# Patient Record
Sex: Female | Born: 1995 | Race: White | Hispanic: No | Marital: Single | Smoking: Never smoker
Health system: Southern US, Community
[De-identification: ages and names within clinical notes are randomized; demographics above are authoritative.]

## PROBLEM LIST (undated history)

## (undated) DIAGNOSIS — K219 Gastro-esophageal reflux disease without esophagitis: Secondary | ICD-10-CM

## (undated) HISTORY — PX: TONSILLECTOMY: SUR1361

---
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
## 2019-11-17 ENCOUNTER — Emergency Department (HOSPITAL_COMMUNITY): Payer: 59

## 2019-11-17 ENCOUNTER — Emergency Department (HOSPITAL_COMMUNITY)
Admission: EM | Admit: 2019-11-17 | Discharge: 2019-11-17 | Disposition: A | Discharge status: Home / Self Care | Payer: 59 | Attending: Emergency Medicine | Admitting: Emergency Medicine

## 2019-11-17 ENCOUNTER — Other Ambulatory Visit: Payer: Self-pay

## 2019-11-17 ENCOUNTER — Encounter (HOSPITAL_COMMUNITY): Payer: Self-pay

## 2019-11-17 DIAGNOSIS — Z79899 Other long term (current) drug therapy: Secondary | ICD-10-CM | POA: Insufficient documentation

## 2019-11-17 DIAGNOSIS — R109 Unspecified abdominal pain: Secondary | ICD-10-CM | POA: Insufficient documentation

## 2019-11-17 DIAGNOSIS — K5901 Slow transit constipation: Secondary | ICD-10-CM | POA: Diagnosis not present

## 2019-11-17 DIAGNOSIS — K5900 Constipation, unspecified: Secondary | ICD-10-CM | POA: Diagnosis present

## 2019-11-17 LAB — I-STAT BETA HCG BLOOD, ED (MC, WL, AP ONLY): I-stat hCG, quantitative: <5 m[IU]/mL (ref <5)

## 2019-11-17 MED ORDER — GOLYTELY 236 G PO SOLR: 4.0000 L | Freq: Once | ORAL | 0 refills | Status: AC

## 2019-11-17 MED ORDER — GOLYTELY 236 G PO SOLR: 4.0000 L | Freq: Once | ORAL | 0 refills | Status: DC

## 2019-11-17 NOTE — ED Provider Notes (Signed)
[place] DEPT Provider Note   CSN: [id number] Arrival date & time: 11/17/19  0016  History Chief Complaint  Patient presents with  . Constipation    Jennifer Wilkerson is a 24 y.o. female.  The history is provided by the patient.  Constipation She states that she has not been able to have a normal bowel movement for the past week.  She has been able to pass a very small amount of stool as well as passing small amount of flatus.  There has been some abdominal cramping which is mild and improved following passage of flatus.  She has had problems with constipation in the past.  She has taken bisacodyl, polyethylene glycol, and attempted using an enema, and took some mineral oil - all with no relief.  History reviewed. No pertinent past medical history.  There are no problems to display for this patient.   Past Surgical History:  Procedure Laterality Date  . TONSILLECTOMY       OB History   No obstetric history on file.     History reviewed. No pertinent family history.  Social History   Tobacco Use  . Smoking status: Never Smoker  . Smokeless tobacco: Never Used  Substance Use Topics  . Alcohol use: No  . Drug use: No    Home Medications Prior to Admission medications   Medication Sig Start Date End Date Taking? Authorizing Provider  cyclobenzaprine (FLEXERIL) 5 MG tablet Take 1 tablet (5 mg total) by mouth 3 (three) times daily as needed for muscle spasms. 08/18/16   Ashley Murrain, NP  naproxen (NAPROSYN) 500 MG tablet Take 1 tablet (500 mg total) by mouth 2 (two) times daily. 08/18/16   Ashley Murrain, NP  sertraline (ZOLOFT) 50 MG tablet Take 50 mg by mouth daily.    [provider]    Allergies    Patient has no known allergies.  Review of Systems   Review of Systems  Gastrointestinal: Positive for constipation.  All other systems reviewed and are negative.   Physical Exam Updated Vital Signs BP 116/82 (BP Location:  Right Arm)   Pulse (!) 103   Temp 98.4 F (36.9 C) (Oral)   Resp 18   Ht 5\' 6"  (1.676 m)   Wt 88.5 kg   SpO2 98%   BMI 31.47 kg/m   Physical Exam Vitals and nursing note reviewed.   24 year old female, resting comfortably and in no acute distress. Vital signs are significant for borderline elevated heart rate. Oxygen saturation is 98%, which is normal. Head is normocephalic and atraumatic. PERRLA, EOMI. Oropharynx is clear. Neck is nontender and supple without adenopathy or JVD. Back is nontender and there is no CVA tenderness. Lungs are clear without rales, wheezes, or rhonchi. Chest is nontender. Heart has regular rate and rhythm without murmur. Abdomen is soft, flat, nontender without masses or hepatosplenomegaly and peristalsis is normoactive. Rectal: Normal sphincter tone.  No palpable stool present. Extremities have no cyanosis or edema, full range of motion is present. Skin is warm and dry without rash. Neurologic: Mental status is normal, cranial nerves are intact, there are no motor or sensory deficits.  ED Results / Procedures / Treatments    Radiology DG Abdomen 1 View  Result Date: 11/17/2019 CLINICAL DATA:  Constipation EXAM: ABDOMEN - 1 VIEW COMPARISON:  None. FINDINGS: The stool burden is average. There is mild gaseous distention of the colon. The bowel gas pattern is nonobstructive. IMPRESSION: 1. Nonobstructive bowel  gas pattern. 2. Average stool burden. Electronically Signed   By: Constance Holster M.D.   On: 11/17/2019 02:30    Procedures Procedures   Medications Ordered in ED Medications - No data to display  ED Course  I have reviewed the triage vital signs and the nursing notes.  Pertinent imaging results that were available during my care of the patient were reviewed by me and considered in my medical decision making (see chart for details).  MDM Rules/Calculators/A&P Constipation without evidence of fecal impaction.  Will check abdominal x-ray  to assess stool burden.  Old records are reviewed, and she has no relevant past visits.  X-rays shows normal stool burden, but stool is mostly in the right colon.  Patient now tells me that she also had taken a dose of magnesium citrate without any improvement.  She is given a prescription for GoLYTELY, advised to take fiber supplements as needed.  She states that she currently does take a fiber supplement, advised that the dose should be increased until she is having bowel movements on a regular basis.  This may be more than what is suggested on the product label.  Also recommended use of polyethylene glycol as needed.  She is referred to gastroenterology for further outpatient evaluation.  She may be a candidate for Linzess.  Final Clinical Impression(s) / ED Diagnoses Final diagnoses:  Constipation by delayed colonic transit    Rx / DC Orders ED Discharge Orders         Ordered    polyethylene glycol (GOLYTELY) 236 g solution   Once     11/17/19 [id number]           Delora Fuel, MD [id number] [phone]

## 2019-11-17 NOTE — ED Triage Notes (Signed)
Pt reports constipation x 1 weeks. She states that she was able to have a small BM today with small amounts of bright red blood. Reports that it was painful. Denies vomiting.

## 2019-11-17 NOTE — Discharge Instructions (Signed)
Please increase your fiber supplement until you are having regular bowel movements without having to strain.  He may have to take a larger amount of the supplement than the instructions on the bottle tell you to take.  You may take polyethylene glycol (MiraLAX) as needed.  You may take up to 4 doses a day as needed.

## 2021-04-29 ENCOUNTER — Other Ambulatory Visit: Payer: Self-pay | Admitting: Physician Assistant

## 2021-04-29 DIAGNOSIS — R109 Unspecified abdominal pain: Secondary | ICD-10-CM

## 2021-04-29 DIAGNOSIS — R197 Diarrhea, unspecified: Secondary | ICD-10-CM

## 2021-05-18 ENCOUNTER — Ambulatory Visit
Admission: RE | Admit: 2021-05-18 | Discharge: 2021-05-18 | Disposition: A | Discharge status: Home / Self Care | Payer: 59 | Source: Ambulatory Visit | Attending: Physician Assistant | Admitting: Physician Assistant

## 2021-05-18 ENCOUNTER — Other Ambulatory Visit: Payer: Self-pay

## 2021-05-18 DIAGNOSIS — R109 Unspecified abdominal pain: Secondary | ICD-10-CM

## 2021-05-18 DIAGNOSIS — R197 Diarrhea, unspecified: Secondary | ICD-10-CM

## 2021-05-18 MED ORDER — IOPAMIDOL (ISOVUE-300) INJECTION 61%
100.0000 mL | Freq: Once | INTRAVENOUS | Status: AC | PRN
  Administered 2021-05-18: 100 mL via INTRAVENOUS

## 2021-06-28 NOTE — H&P (Signed)
Jennifer Wilkerson is an 25 y.o. G0 who is admitted for Laparoscopic ovarian cystectomy, removal of pelvic mass, and possible unilateral salpingo-oophorectomy.  Patient had initial GYN evaluation on 9/14 with Evlyn Kanner, FNP-C as a referral from [facility], [place]) for an incidental finding of a left adnexal mass measuring 11.2cm on CT A/P on 05/18/21.  Patient was undergoing GI work-up for evaluation of diarrhea with associated loose stools, reflux, and abdominal pain which have progressively gotten worse for the past year.  As documented below, TVUS revealed 12.3cm mass on the left side.  Counseled patient regarding risks/benefits/alternatives regarding surveillance vs surgical management. Reviewed recommendation regarding surgical management which patient was in agreement.  Pertinent Labs: ROMA: CA-125 13.4  HE4 38.3  Score 0.41 (premenopausal) -  Low risk for malignancy Normal pap smear in 2021, per patient  TVUS (06/16/2021): Uterus 7.78 x 4.45 x 4.71cm Endometrial thickness 1.42cm Anteverted uterus. No uterine anomalies. Endometrium thickened - no blood flow noted. Right ovary within normal limits. Left ovary not visualized. Left adnexa - cyst with tiny echogenic particles seen within 12.3 x 9.0 x 10.4cm, avascular - questionable left ovary   CT A/P (05/18/21): FINDINGS: Lower Chest: No acute findings.   Hepatobiliary: No hepatic masses identified. Gallbladder is unremarkable. No evidence of biliary ductal dilatation.   Pancreas:  No mass or inflammatory changes.   Spleen: Within normal limits in size and appearance.   Adrenals/Urinary Tract: No masses identified. No evidence of ureteral calculi or hydronephrosis. Unremarkable unopacified urinary bladder.   Stomach/Bowel: No evidence of bowel wall thickening, abnormal contrast enhancement, or mesenteric inflammatory changes. No evidence of stricture or dilatation. No signs of penetrating disease, fistulae, or abnormal  fluid collections. The terminal ileum is normal in appearance. Normal appendix visualized.   Vascular/Lymphatic: No pathologically enlarged lymph nodes. No acute vascular findings.   Reproductive: Uterus is normal in appearance. A large simple appearing cystic lesion is seen in the left adnexa which measures 11.2 x 8.8 cm. No other masses identified. No evidence of inflammatory process or free fluid.   Other:  None.   Musculoskeletal:  No suspicious bone lesions identified.   IMPRESSION: No radiographic evidence of inflammatory bowel disease.   11.2 cm benign-appearing cystic lesion in left adnexa. 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 Recommend GYN consult and consider pelvis MRI w/o and w/ contrast if clinically warranted. Note: This recommendation does not apply to premenarchal patients and to those with increased risk (genetic, family history, elevated tumor markers or other high-risk factors) of ovarian cancer. Reference: JACR 2020 Feb; 17(2):248-254.   Patient Active Problem List   Diagnosis Date Noted   BMI 40.0-44.9, adult ([place]) 07/03/2021   Adnexal mass 07/03/2021     MEDICAL/FAMILY/SOCIAL HX: No LMP recorded.    No past medical history on file.  Past Surgical History:  Procedure Laterality Date   TONSILLECTOMY      No family history on file.  Social History:  reports that she has never smoked. She has never used smokeless tobacco. She reports that she does not drink alcohol and does not use drugs.  ALLERGIES/MEDS:  Allergies: No Known Allergies  No medications prior to admission.     Review of Systems  Constitutional: Negative.   HENT: Negative.    Eyes: Negative.   Respiratory: Negative.    Cardiovascular: Negative.   Gastrointestinal: Negative.   Genitourinary: Negative.   Musculoskeletal: Negative.   Skin: Negative.   Endo/Heme/Allergies: Negative.   Psychiatric/Behavioral: Negative.  All other systems reviewed and are  negative.  There were no vitals taken for this visit. Gen:  NAD, pleasant and cooperative Cardio:  RRR Pulm:  CTAB, no wheezes/rales/rhonchi Abd:  Soft, non-distended, non-tender throughout, no rebound/guarding, difficult to appreciate mass given patient's habitus Ext:  No bilateral LE edema, no bilateral calf tenderness  No results found for this or any previous visit (from the past 24 hour(s)).  No results found.   ASSESSMENT/PLAN: Jennifer Wilkerson is a 25 y.o. G0 who is admitted for Laparoscopic ovarian cystectomy, removal of pelvic mass, and possible unilateral salpingo-oophorectomy.  - Admit to [facility] labs (CBC, T&S, COVID screen) - Diet:  NPO - IVF:  Per anesthesia - VTE Prophylaxis:  SCDs - Antibiotics: None - Anticipate D/C home same-day  Consents: I have counseled the patient that this surgery is performed to remove a pelvic mass through an incision in the abdomen. Prior to surgery, the risks and benefits of the surgery, as well as alternative treatments, have been discussed. The risks include, but are not limited to, bleeding, including the need for a blood transfusion, infection, damage to surrounding organs and tissues, damage to bladder, damage to ureters, causing kidney damage, and requiring additional procedures, damage to bowels, resulting in further surgery and/or a colostomy, postoperative pain, short-term and long-term, scarring on and inside the abdominal wall, development of an incisional hernia which could require surgery in the future, need for further surgery, deep vein thrombosis and/or pulmonary embolism, wound separation or infection, painful intercourse, urinary leakage, ovarian failure, resulting in menopausal symptoms requiring treatment, complications the course of which cannot be predicted or prevented, and death.  It was also discussed that there is a chance that the mass being removed is found to be cancer during or after your surgery.  There is a  chance that cancer cells could be spread to other parts of your body during the surgery.  There is a chance that additional procedures will be performed to diagnose and treat a cancer fully.  There is a chance that cancer discovered after your surgery is over may need treatments in the future, including additional surgery.  Reviewed possibility regarding need for oophorectomy at the time of surgery if unable to remove cyst alone. Patient was consented for blood products.  The patient is aware that bleeding may result in the need for a blood transfusion which includes risk of transmission of HIV (1:2 million), Hepatitis C (1:2 million), and Hepatitis B (1:200 thousand) and transfusion reaction.  Patient voiced understanding of the above risks as well as understanding of indications for blood transfusion.  Drema Dallas, DO [phone] (office)

## 2021-06-29 ENCOUNTER — Other Ambulatory Visit: Payer: Self-pay

## 2021-06-29 ENCOUNTER — Encounter (HOSPITAL_BASED_OUTPATIENT_CLINIC_OR_DEPARTMENT_OTHER): Payer: Self-pay | Admitting: Obstetrics and Gynecology

## 2021-07-01 ENCOUNTER — Encounter (HOSPITAL_BASED_OUTPATIENT_CLINIC_OR_DEPARTMENT_OTHER)
Admission: RE | Admit: 2021-07-01 | Discharge: 2021-07-01 | Disposition: A | Discharge status: Home / Self Care | Payer: 59 | Source: Ambulatory Visit | Attending: Obstetrics and Gynecology | Admitting: Obstetrics and Gynecology

## 2021-07-01 DIAGNOSIS — Z01818 Encounter for other preprocedural examination: Secondary | ICD-10-CM | POA: Insufficient documentation

## 2021-07-01 LAB — CBC
HCT: 41.9 % (ref 36.0–46.0)
Hemoglobin: 14 g/dL (ref 12.0–15.0)
MCH: 29.6 pg (ref 26.0–34.0)
MCHC: 33.4 g/dL (ref 30.0–36.0)
MCV: 88.6 fL (ref 80.0–100.0)
Platelets: 338 10*3/uL (ref 150–400)
RBC: 4.73 MIL/uL (ref 3.87–5.11)
RDW: 13.4 % (ref 11.5–15.5)
WBC: 9.4 10*3/uL (ref 4.0–10.5)
nRBC: 0 % (ref 0.0–0.2)

## 2021-07-01 LAB — TYPE AND SCREEN
ABO/RH(D): O NEG
Antibody Screen: NEGATIVE

## 2021-07-03 DIAGNOSIS — Z6841 Body Mass Index (BMI) 40.0 and over, adult: Secondary | ICD-10-CM

## 2021-07-03 DIAGNOSIS — N9489 Other specified conditions associated with female genital organs and menstrual cycle: Secondary | ICD-10-CM

## 2021-07-05 ENCOUNTER — Ambulatory Visit (HOSPITAL_BASED_OUTPATIENT_CLINIC_OR_DEPARTMENT_OTHER): Payer: 59 | Admitting: Anesthesiology

## 2021-07-05 ENCOUNTER — Encounter (HOSPITAL_BASED_OUTPATIENT_CLINIC_OR_DEPARTMENT_OTHER): Payer: Self-pay | Admitting: Obstetrics and Gynecology

## 2021-07-05 ENCOUNTER — Other Ambulatory Visit: Payer: Self-pay

## 2021-07-05 ENCOUNTER — Encounter (HOSPITAL_BASED_OUTPATIENT_CLINIC_OR_DEPARTMENT_OTHER)
Admission: RE | Disposition: A | Discharge status: Home / Self Care | Payer: Self-pay | Source: Ambulatory Visit | Attending: Obstetrics and Gynecology

## 2021-07-05 ENCOUNTER — Ambulatory Visit (HOSPITAL_BASED_OUTPATIENT_CLINIC_OR_DEPARTMENT_OTHER)
Admission: RE | Admit: 2021-07-05 | Discharge: 2021-07-05 | Disposition: A | Discharge status: Home / Self Care | Payer: 59 | Source: Ambulatory Visit | Attending: Obstetrics and Gynecology | Admitting: Obstetrics and Gynecology

## 2021-07-05 DIAGNOSIS — D271 Benign neoplasm of left ovary: Secondary | ICD-10-CM | POA: Insufficient documentation

## 2021-07-05 DIAGNOSIS — Z6841 Body Mass Index (BMI) 40.0 and over, adult: Secondary | ICD-10-CM

## 2021-07-05 DIAGNOSIS — N9489 Other specified conditions associated with female genital organs and menstrual cycle: Secondary | ICD-10-CM

## 2021-07-05 DIAGNOSIS — N83202 Unspecified ovarian cyst, left side: Secondary | ICD-10-CM | POA: Diagnosis present

## 2021-07-05 HISTORY — PX: LAPAROSCOPIC OVARIAN CYSTECTOMY: SHX6248

## 2021-07-05 HISTORY — DX: Gastro-esophageal reflux disease without esophagitis: K21.9

## 2021-07-05 LAB — POCT PREGNANCY, URINE: Preg Test, Ur: NEGATIVE

## 2021-07-05 LAB — ABO/RH: ABO/RH(D): O NEG

## 2021-07-05 SURGERY — EXCISION, CYST, OVARY, LAPAROSCOPIC
Anesthesia: General | Site: Uterus

## 2021-07-05 MED ORDER — ONDANSETRON HCL 4 MG/2ML IJ SOLN: 4.0000 mg | Freq: Once | INTRAMUSCULAR | Status: DC | PRN

## 2021-07-05 MED ORDER — OXYCODONE HCL 5 MG PO TABS: 5.0000 mg | ORAL_TABLET | Freq: Once | ORAL | Status: DC | PRN

## 2021-07-05 MED ORDER — PROPOFOL 10 MG/ML IV BOLUS
INTRAVENOUS | Status: DC | PRN
Start: 2021-07-05 — End: 2021-07-05
  Administered 2021-07-05: 200 mg via INTRAVENOUS

## 2021-07-05 MED ORDER — DEXAMETHASONE SODIUM PHOSPHATE 4 MG/ML IJ SOLN
INTRAMUSCULAR | Status: DC | PRN
  Administered 2021-07-05: 10 mg via INTRAVENOUS

## 2021-07-05 MED ORDER — SILVER NITRATE-POT NITRATE 75-25 % EX MISC
CUTANEOUS | Status: AC
  Filled 2021-07-05: qty 10

## 2021-07-05 MED ORDER — BUPIVACAINE HCL (PF) 0.25 % IJ SOLN
INTRAMUSCULAR | Status: DC | PRN
  Administered 2021-07-05: 30 mL

## 2021-07-05 MED ORDER — 0.9 % SODIUM CHLORIDE (POUR BTL) OPTIME
TOPICAL | Status: DC | PRN
  Administered 2021-07-05: 3000 mL

## 2021-07-05 MED ORDER — SUGAMMADEX SODIUM 500 MG/5ML IV SOLN
INTRAVENOUS | Status: DC | PRN
  Administered 2021-07-05: 300 mg via INTRAVENOUS

## 2021-07-05 MED ORDER — OXYCODONE HCL 5 MG PO TABS: 5.0000 mg | ORAL_TABLET | Freq: Four times a day (QID) | ORAL | 0 refills | Status: AC | PRN

## 2021-07-05 MED ORDER — FENTANYL CITRATE (PF) 100 MCG/2ML IJ SOLN
INTRAMUSCULAR | Status: AC
  Filled 2021-07-05: qty 2

## 2021-07-05 MED ORDER — ALBUTEROL SULFATE HFA 108 (90 BASE) MCG/ACT IN AERS
INHALATION_SPRAY | RESPIRATORY_TRACT | Status: DC | PRN
  Administered 2021-07-05 (×2): 2 via RESPIRATORY_TRACT

## 2021-07-05 MED ORDER — AMISULPRIDE (ANTIEMETIC) 5 MG/2ML IV SOLN
10.0000 mg | Freq: Once | INTRAVENOUS | Status: AC | PRN
  Administered 2021-07-05: 10 mg via INTRAVENOUS

## 2021-07-05 MED ORDER — PHENYLEPHRINE HCL (PRESSORS) 10 MG/ML IV SOLN
INTRAVENOUS | Status: DC | PRN
  Administered 2021-07-05: 120 ug via INTRAVENOUS

## 2021-07-05 MED ORDER — PHENYLEPHRINE 40 MCG/ML (10ML) SYRINGE FOR IV PUSH (FOR BLOOD PRESSURE SUPPORT)
PREFILLED_SYRINGE | INTRAVENOUS | Status: AC
  Filled 2021-07-05: qty 10

## 2021-07-05 MED ORDER — FENTANYL CITRATE (PF) 100 MCG/2ML IJ SOLN
INTRAMUSCULAR | Status: DC | PRN
  Administered 2021-07-05: 100 ug via INTRAVENOUS
  Administered 2021-07-05 (×2): 50 ug via INTRAVENOUS

## 2021-07-05 MED ORDER — LIDOCAINE HCL (CARDIAC) PF 100 MG/5ML IV SOSY
PREFILLED_SYRINGE | INTRAVENOUS | Status: DC | PRN
  Administered 2021-07-05: 60 mg via INTRAVENOUS

## 2021-07-05 MED ORDER — ROCURONIUM BROMIDE 100 MG/10ML IV SOLN
INTRAVENOUS | Status: DC | PRN
  Administered 2021-07-05: 10 mg via INTRAVENOUS
  Administered 2021-07-05: 70 mg via INTRAVENOUS

## 2021-07-05 MED ORDER — IBUPROFEN 800 MG PO TABS: 800.0000 mg | ORAL_TABLET | Freq: Three times a day (TID) | ORAL | 0 refills | Status: AC | PRN

## 2021-07-05 MED ORDER — LACTATED RINGERS IV SOLN: INTRAVENOUS | Status: DC

## 2021-07-05 MED ORDER — MIDAZOLAM HCL 2 MG/2ML IJ SOLN
INTRAMUSCULAR | Status: AC
  Filled 2021-07-05: qty 2

## 2021-07-05 MED ORDER — AMISULPRIDE (ANTIEMETIC) 5 MG/2ML IV SOLN
INTRAVENOUS | Status: AC
  Filled 2021-07-05: qty 4

## 2021-07-05 MED ORDER — OXYCODONE HCL 5 MG/5ML PO SOLN: 5.0000 mg | Freq: Once | ORAL | Status: DC | PRN

## 2021-07-05 MED ORDER — FENTANYL CITRATE (PF) 100 MCG/2ML IJ SOLN
25.0000 ug | INTRAMUSCULAR | Status: DC | PRN
  Administered 2021-07-05: 50 ug via INTRAVENOUS

## 2021-07-05 MED ORDER — ONDANSETRON HCL 4 MG/2ML IJ SOLN
INTRAMUSCULAR | Status: DC | PRN
  Administered 2021-07-05: 4 mg via INTRAVENOUS

## 2021-07-05 MED ORDER — MIDAZOLAM HCL 5 MG/5ML IJ SOLN
INTRAMUSCULAR | Status: DC | PRN
  Administered 2021-07-05: 2 mg via INTRAVENOUS

## 2021-07-05 SURGICAL SUPPLY — 37 items
ADH SKN CLS APL DERMABOND .7 (GAUZE/BANDAGES/DRESSINGS) ×1
APL SRG 38 LTWT LNG FL B (MISCELLANEOUS) ×1
APPLICATOR ARISTA FLEXITIP XL (MISCELLANEOUS) ×2 IMPLANT
BAG SPEC RTRVL LRG 6X4 10 (ENDOMECHANICALS)
CABLE HIGH FREQUENCY MONO STRZ (ELECTRODE) ×2 IMPLANT
DERMABOND ADVANCED (GAUZE/BANDAGES/DRESSINGS) ×1
DERMABOND ADVANCED .7 DNX12 (GAUZE/BANDAGES/DRESSINGS) ×1 IMPLANT
DEVICE TROCAR PUNCTURE CLOSURE (ENDOMECHANICALS) ×2 IMPLANT
DRSG OPSITE POSTOP 3X4 (GAUZE/BANDAGES/DRESSINGS) IMPLANT
DURAPREP 26ML APPLICATOR (WOUND CARE) ×2 IMPLANT
GAUZE 4X4 16PLY ~~LOC~~+RFID DBL (SPONGE) ×2 IMPLANT
GLOVE SURG ENC MOIS LTX SZ6.5 (GLOVE) ×4 IMPLANT
GLOVE SURG UNDER POLY LF SZ6.5 (GLOVE) ×2 IMPLANT
GLOVE SURG UNDER POLY LF SZ7 (GLOVE) ×6 IMPLANT
GOWN STRL REUS W/ TWL LRG LVL3 (GOWN DISPOSABLE) ×3 IMPLANT
GOWN STRL REUS W/TWL LRG LVL3 (GOWN DISPOSABLE) ×6
HEMOSTAT ARISTA ABSORB 3G PWDR (HEMOSTASIS) ×2 IMPLANT
IRRIG SUCT STRYKERFLOW 2 WTIP (MISCELLANEOUS) ×2
IRRIGATION SUCT STRKRFLW 2 WTP (MISCELLANEOUS) ×1 IMPLANT
LIGASURE VESSEL 5MM BLUNT TIP (ELECTROSURGICAL) ×2 IMPLANT
NS IRRIG 1000ML POUR BTL (IV SOLUTION) ×2 IMPLANT
PACK LAPAROSCOPY BASIN (CUSTOM PROCEDURE TRAY) ×2 IMPLANT
PACK TRENDGUARD 450 HYBRID PRO (MISCELLANEOUS) ×1 IMPLANT
PAD OB MATERNITY 4.3X12.25 (PERSONAL CARE ITEMS) ×2 IMPLANT
POUCH SPECIMEN RETRIEVAL 10MM (ENDOMECHANICALS) IMPLANT
SET TUBE SMOKE EVAC HIGH FLOW (TUBING) ×2 IMPLANT
SHEARS HARMONIC ACE PLUS 36CM (ENDOMECHANICALS) ×2 IMPLANT
SLEEVE ENDOPATH XCEL 5M (ENDOMECHANICALS) ×2 IMPLANT
SOLUTION ELECTROLUBE (MISCELLANEOUS) ×2 IMPLANT
SUT VICRYL 0 UR6 27IN ABS (SUTURE) ×4 IMPLANT
SUT VICRYL 4-0 PS2 18IN ABS (SUTURE) ×4 IMPLANT
TOWEL GREEN STERILE FF (TOWEL DISPOSABLE) ×4 IMPLANT
TRAY FOLEY W/BAG SLVR 14FR LF (SET/KITS/TRAYS/PACK) ×2 IMPLANT
TRENDGUARD 450 HYBRID PRO PACK (MISCELLANEOUS) ×2
TROCAR XCEL NON-BLD 11X100MML (ENDOMECHANICALS) ×2 IMPLANT
TROCAR XCEL NON-BLD 5MMX100MML (ENDOMECHANICALS) ×2 IMPLANT
WARMER LAPAROSCOPE (MISCELLANEOUS) ×2 IMPLANT

## 2021-07-05 NOTE — Interval H&P Note (Signed)
History and Physical Interval Note:  07/05/2021 12:42 PM  Jennifer Wilkerson  has presented today for surgery, with the diagnosis of Adnexal Mass.  The various methods of treatment have been discussed with the patient and family. After consideration of risks, benefits and other options for treatment, the patient has consented to  Procedure(s): LAPAROSCOPIC OVARIAN CYSTECTOMY (N/A), Possible oophorectomy, removal of pelvic mass as a surgical intervention.  The patient's history has been reviewed, patient examined, no change in status, stable for surgery.  I have reviewed the patient's chart and labs.  Questions were answered to the patient's satisfaction.     Drema Dallas

## 2021-07-05 NOTE — Anesthesia Procedure Notes (Signed)
Procedure Name: Intubation Date/Time: 07/05/2021 1:10 PM Performed by: Willa Frater, CRNA Pre-anesthesia Checklist: Patient identified, Emergency Drugs available, Suction available and Patient being monitored Patient Re-evaluated:Patient Re-evaluated prior to induction Oxygen Delivery Method: Circle system utilized Preoxygenation: Pre-oxygenation with 100% oxygen Induction Type: IV induction Ventilation: Mask ventilation without difficulty Laryngoscope Size: Mac, 4 and Glidescope Grade View: Grade II Tube type: Oral Number of attempts: 1 Airway Equipment and Method: Stylet and Oral airway Placement Confirmation: ETT inserted through vocal cords under direct vision, positive ETCO2 and breath sounds checked- equal and bilateral Secured at: 21 cm Tube secured with: Tape Dental Injury: Teeth and Oropharynx as per pre-operative assessment  Difficulty Due To: Difficulty was unanticipated

## 2021-07-05 NOTE — Discharge Instructions (Signed)

## 2021-07-05 NOTE — Op Note (Signed)
Pre Op Dx:   1. Large adnexal mass  Post Op Dx:   1. Left ovarian cyst  Procedure:  1. Operative Laparoscopy 2. Left Ovarian Cystectomy 3. Pelvic washings   Surgeon:  Dr. Drema Dallas Assistants:  Dr. Christophe Louis (assistant needed due to complexity of the anatomy) Anesthesia:  General   EBL:  10cc  IVF:  See anesthesia documentation UOP:  See anesthesia documentation   Drains:  Foley catheter Specimen removed:  Left ovarian cyst wall - sent to pathology  Pelvic washings - sent to cytology Device(s) implanted: None Case Type:  Clean Findings:  Normal-appearing uterus, right ovary, and bilateral fallopian tubes. Enlarged ~12cm left ovarian cyst with clear, serous fluid. Complications: None Indications:  25 y.o. G0 with a large 12.3cm left adnexal mass noted on [place]. Description of Procedure: After informed consent the patient was taken to the operating room where general endotracheal anesthesia was administered and found to be adequate. She was placed in dorsal lithotomy position. She was prepped and draped in the usual sterile fashion. A foley catheter was placed. An operative timeout was performed. A Hulka tenaculum was placed in the cervix. The abdomen was entered through an intraumbilical incision using a 71mm trocar under direct visualization and a pneumoperitoneum was established. Bilateral lower quadrant ports were placed under direct visualization. The entry point was visualized from the inferior port and atraumatic entry confirmed. The findings as above were noted. Pelvic washings were obtained. The left ovarian cyst was opened using electrosurgery. The cyst was drained (serious contents noted). The cyst wall was removed in its entirety using blunt dissection and the cyst wall was removed. Adequate hemostasis was noted. Arista hemostatic agent was placed on the ovary. The umbilical port was withdrawn under visualization and the pneumoperitoneum was reduced completely. All ports were  removed. The fascia in the umbilical port site was closed using 0-Vicryl. The skin was closed with 4-0 Vicryl in subcuticular fashion and skin glue was placed atop each incision. The tenaculum was removed and cervical hemostasis confirmed. The patient was returned to dorsal supine position, awakened and extubated in the OR having appeared to tolerate the procedure well. All sponge, needle, and instrument counts were correct x 2 at the end of the case.  Disposition:  PACU  Drema Dallas, DO

## 2021-07-05 NOTE — Anesthesia Preprocedure Evaluation (Signed)
Anesthesia Evaluation  Patient identified by MRN, date of birth, ID band Patient awake    Reviewed: Allergy & Precautions, NPO status , Patient's Chart, lab work & pertinent test results  History of Anesthesia Complications Negative for: history of anesthetic complications  Airway Mallampati: II  TM Distance: >3 FB Neck ROM: Full    Dental  (+) Teeth Intact, Dental Advisory Given   Pulmonary neg pulmonary ROS, Patient abstained from smoking.,    Pulmonary exam normal        Cardiovascular negative cardio ROS Normal cardiovascular exam     Neuro/Psych negative neurological ROS     GI/Hepatic Neg liver ROS, GERD  ,  Endo/Other  Morbid obesity  Renal/GU negative Renal ROS  negative genitourinary   Musculoskeletal negative musculoskeletal ROS (+)   Abdominal   Peds  Hematology negative hematology ROS (+)   Anesthesia Other Findings   Reproductive/Obstetrics Adnexal mass                             Anesthesia Physical Anesthesia Plan  ASA: 3  Anesthesia Plan: General   Post-op Pain Management:    Induction: Intravenous  PONV Risk Score and Plan: 3 and Ondansetron, Dexamethasone, Treatment may vary due to age or medical condition and Midazolam  Airway Management Planned: Oral ETT  Additional Equipment: None  Intra-op Plan:   Post-operative Plan: Extubation in OR  Informed Consent: I have reviewed the patients History and Physical, chart, labs and discussed the procedure including the risks, benefits and alternatives for the proposed anesthesia with the patient or authorized representative who has indicated his/her understanding and acceptance.     Dental advisory given  Plan Discussed with:   Anesthesia Plan Comments:         Anesthesia Quick Evaluation

## 2021-07-05 NOTE — Addendum Note (Signed)
Addendum  created 07/05/21 1927 by Willa Frater, CRNA   Child order released for a procedure order, Clinical Note Signed, Intraprocedure Blocks edited, LDA created via procedure documentation, SmartForm saved

## 2021-07-05 NOTE — Transfer of Care (Signed)
Immediate Anesthesia Transfer of Care Note  Patient: Jennifer Wilkerson  Procedure(s) Performed: LAPAROSCOPIC OVARIAN CYSTECTOMY (Uterus)  Patient Location: PACU  Anesthesia Type:General  Level of Consciousness: drowsy, patient cooperative and responds to stimulation  Airway & Oxygen Therapy: Patient Spontanous Breathing and Patient connected to face mask oxygen  Post-op Assessment: Report given to RN and Post -op Vital signs reviewed and stable  Post vital signs: Reviewed and stable  Last Vitals:  Vitals Value Taken Time  BP    Temp    Pulse 101 07/05/21 1512  Resp 25 07/05/21 1512  SpO2 99 % 07/05/21 1512  Vitals shown include unvalidated device data.  Last Pain:  Vitals:   07/05/21 1122  PainSc: 0-No pain      Patients Stated Pain Goal: 4 (79/43/27 6147)  Complications: No notable events documented.

## 2021-07-05 NOTE — Anesthesia Postprocedure Evaluation (Signed)
Anesthesia Post Note  Patient: Jennifer Wilkerson  Procedure(s) Performed: LAPAROSCOPIC OVARIAN CYSTECTOMY (Uterus)     Patient location during evaluation: PACU Anesthesia Type: General Level of consciousness: awake and alert Pain management: pain level controlled Vital Signs Assessment: post-procedure vital signs reviewed and stable Respiratory status: spontaneous breathing, nonlabored ventilation and respiratory function stable Cardiovascular status: blood pressure returned to baseline and stable Postop Assessment: no apparent nausea or vomiting Anesthetic complications: no   No notable events documented.  Last Vitals:  Vitals:   07/05/21 1545 07/05/21 1600  BP: 125/76 127/62  Pulse: 88 82  Resp: 16 16  Temp:    SpO2: 96% 96%    Last Pain:  Vitals:   07/05/21 1600  PainSc: 2                  Lidia Collum

## 2021-07-06 ENCOUNTER — Emergency Department (HOSPITAL_COMMUNITY)
Admission: EM | Admit: 2021-07-06 | Discharge: 2021-07-07 | Disposition: A | Discharge status: Home / Self Care | Payer: 59 | Attending: Emergency Medicine | Admitting: Emergency Medicine

## 2021-07-06 ENCOUNTER — Encounter (HOSPITAL_BASED_OUTPATIENT_CLINIC_OR_DEPARTMENT_OTHER): Payer: Self-pay | Admitting: Obstetrics and Gynecology

## 2021-07-06 ENCOUNTER — Other Ambulatory Visit: Payer: Self-pay

## 2021-07-06 DIAGNOSIS — R1013 Epigastric pain: Secondary | ICD-10-CM | POA: Diagnosis present

## 2021-07-06 DIAGNOSIS — G8918 Other acute postprocedural pain: Secondary | ICD-10-CM | POA: Diagnosis not present

## 2021-07-06 DIAGNOSIS — R102 Pelvic and perineal pain: Secondary | ICD-10-CM | POA: Insufficient documentation

## 2021-07-06 DIAGNOSIS — L03311 Cellulitis of abdominal wall: Secondary | ICD-10-CM | POA: Insufficient documentation

## 2021-07-06 LAB — URINALYSIS, ROUTINE W REFLEX MICROSCOPIC
Bilirubin Urine: NEGATIVE
Glucose, UA: NEGATIVE mg/dL
Ketones, ur: NEGATIVE mg/dL
Nitrite: NEGATIVE
Protein, ur: NEGATIVE mg/dL
Specific Gravity, Urine: 1.004 — ABNORMAL LOW (ref 1.005–1.030)
pH: 7 (ref 5.0–8.0)

## 2021-07-06 LAB — CBC
HCT: 38.7 % (ref 36.0–46.0)
Hemoglobin: 13.1 g/dL (ref 12.0–15.0)
MCH: 30.1 pg (ref 26.0–34.0)
MCHC: 33.9 g/dL (ref 30.0–36.0)
MCV: 89 fL (ref 80.0–100.0)
Platelets: 377 10*3/uL (ref 150–400)
RBC: 4.35 MIL/uL (ref 3.87–5.11)
RDW: 13.8 % (ref 11.5–15.5)
WBC: 16.2 10*3/uL — ABNORMAL HIGH (ref 4.0–10.5)
nRBC: 0 % (ref 0.0–0.2)

## 2021-07-06 LAB — CYTOLOGY - NON PAP

## 2021-07-06 LAB — I-STAT BETA HCG BLOOD, ED (MC, WL, AP ONLY): I-stat hCG, quantitative: <5 m[IU]/mL (ref <5)

## 2021-07-06 LAB — LACTIC ACID, PLASMA: Lactic Acid, Venous: 1.5 mmol/L (ref 0.5–1.9)

## 2021-07-06 MED ORDER — ONDANSETRON 4 MG PO TBDP
4.0000 mg | ORAL_TABLET | Freq: Once | ORAL | Status: AC | PRN
  Administered 2021-07-06: 4 mg via ORAL
  Filled 2021-07-06: qty 1

## 2021-07-06 NOTE — ED Triage Notes (Signed)
Pt here from home for abd pain, N/V after having an ovarian cyst removed laparoscopically yesterday. Pt had vomiting this morning, able to tolerate water. Has had epigastric pains and lower abd burning. Pt noticed a red streak coming from belly button tonight. Pt reports fevers/chills at home, took tylenol w/ temporary relief.

## 2021-07-07 ENCOUNTER — Emergency Department (HOSPITAL_COMMUNITY): Payer: 59

## 2021-07-07 LAB — COMPREHENSIVE METABOLIC PANEL
ALT: 19 U/L (ref 0–44)
AST: 15 U/L (ref 15–41)
Albumin: 3.7 g/dL (ref 3.5–5.0)
Alkaline Phosphatase: 85 U/L (ref 38–126)
Anion gap: 8 (ref 5–15)
BUN: 6 mg/dL (ref 6–20)
CO2: 24 mmol/L (ref 22–32)
Calcium: 8.9 mg/dL (ref 8.9–10.3)
Chloride: 106 mmol/L (ref 98–111)
Creatinine, Ser: 0.66 mg/dL (ref 0.44–1.00)
GFR, Estimated: >60 mL/min (ref >60)
Glucose, Bld: 102 mg/dL — ABNORMAL HIGH (ref 70–99)
Potassium: 3.5 mmol/L (ref 3.5–5.1)
Sodium: 138 mmol/L (ref 135–145)
Total Bilirubin: 0.5 mg/dL (ref 0.3–1.2)
Total Protein: 6.5 g/dL (ref 6.5–8.1)

## 2021-07-07 LAB — LIPASE, BLOOD: Lipase: 151 U/L — ABNORMAL HIGH (ref 11–51)

## 2021-07-07 LAB — SURGICAL PATHOLOGY

## 2021-07-07 MED ORDER — CEPHALEXIN 500 MG PO CAPS: 500.0000 mg | ORAL_CAPSULE | Freq: Two times a day (BID) | ORAL | 0 refills | Status: DC

## 2021-07-07 MED ORDER — ONDANSETRON 4 MG PO TBDP
4.0000 mg | ORAL_TABLET | Freq: Once | ORAL | Status: AC
  Administered 2021-07-07: 4 mg via ORAL
  Filled 2021-07-07: qty 1

## 2021-07-07 MED ORDER — CEPHALEXIN 500 MG PO CAPS: 500.0000 mg | ORAL_CAPSULE | Freq: Two times a day (BID) | ORAL | 0 refills | Status: AC

## 2021-07-07 MED ORDER — OXYCODONE HCL 5 MG PO TABS
5.0000 mg | ORAL_TABLET | Freq: Once | ORAL | Status: AC
Start: 2021-07-07 — End: 2021-07-07
  Administered 2021-07-07: 5 mg via ORAL
  Filled 2021-07-07: qty 1

## 2021-07-07 MED ORDER — IOHEXOL 300 MG/ML  SOLN
100.0000 mL | Freq: Once | INTRAMUSCULAR | Status: AC | PRN
  Administered 2021-07-07: 100 mL via INTRAVENOUS

## 2021-07-07 MED ORDER — ONDANSETRON 4 MG PO TBDP: 4.0000 mg | ORAL_TABLET | Freq: Three times a day (TID) | ORAL | 0 refills | Status: AC | PRN

## 2021-07-07 MED ORDER — SODIUM CHLORIDE 0.9 % IV BOLUS
1000.0000 mL | Freq: Once | INTRAVENOUS | Status: AC
  Administered 2021-07-07: 1000 mL via INTRAVENOUS

## 2021-07-07 MED ORDER — MORPHINE SULFATE (PF) 4 MG/ML IV SOLN
4.0000 mg | Freq: Once | INTRAVENOUS | Status: AC
  Administered 2021-07-07: 4 mg via INTRAVENOUS
  Filled 2021-07-07: qty 1

## 2021-07-07 MED ORDER — HYDROCODONE-ACETAMINOPHEN 5-325 MG PO TABS: 1.0000 | ORAL_TABLET | Freq: Four times a day (QID) | ORAL | 0 refills | Status: AC | PRN

## 2021-07-07 MED ORDER — ONDANSETRON 4 MG PO TBDP: 4.0000 mg | ORAL_TABLET | Freq: Three times a day (TID) | ORAL | 0 refills | Status: DC | PRN

## 2021-07-07 NOTE — ED Notes (Signed)
Patient returned back from CT. 

## 2021-07-07 NOTE — ED Notes (Signed)
Patient transported to CT 

## 2021-07-07 NOTE — ED Provider Notes (Signed)
[facility] Provider Note   CSN: [id number] Arrival date & time: 07/06/21  2244     History Chief Complaint  Patient presents with   Abdominal Pain   Post-op Problem    Jennifer Wilkerson is a 25 y.o. female with a past medical history of GERD, status post laparoscopic ovarian cystectomy on 07/05/2021 presenting to the ED with a chief complaint of abdominal pain, vomiting and fever.  Since discharge states that she has been having lower abdominal discomfort as well as epigastric discomfort.  Reports several episodes of nonbloody, nonbilious emesis.  Her pain is only minimally improved with oxycodone at home.  She does not have an antiemetic at home.  Noticed vaginal bleeding while in the waiting room as well.  Reports some erythema around her umbilicus at the site of a laparoscopic incision.  She was told to come to the ER by her OB/GYN due to her symptoms.  Fever with T-max 101.  Patient denies any diarrhea, cough.  States that the pain was causing her to feel short of breath last night.  No dysuria.  Denies any sick contacts with similar symptoms.  HPI     Past Medical History:  Diagnosis Date   Acid reflux     Patient Active Problem List   Diagnosis Date Noted   BMI 40.0-44.9, adult ([place]) 07/03/2021   Adnexal mass 07/03/2021    Past Surgical History:  Procedure Laterality Date   LAPAROSCOPIC OVARIAN CYSTECTOMY N/A 07/05/2021   Procedure: LAPAROSCOPIC OVARIAN CYSTECTOMY;  Surgeon: Drema Dallas, DO;  Location: [facility];  Service: Gynecology;  Laterality: N/A;   TONSILLECTOMY       OB History   No obstetric history on file.     History reviewed. No pertinent family history.  Social History   Tobacco Use   Smoking status: Never   Smokeless tobacco: Never  Vaping Use   Vaping Use: Never used  Substance Use Topics   Alcohol use: Yes    Comment: rarely   Drug use: Yes    Types: Marijuana    Comment: last used  a week ago    Home Medications Prior to Admission medications   Medication Sig Start Date End Date Taking? Authorizing Provider  cephALEXin (KEFLEX) 500 MG capsule Take 1 capsule (500 mg total) by mouth 2 (two) times daily for 7 days. 07/07/21 07/14/21  Cormac Wint, PA-C  Ferrous Sulfate (IRON PO) Take by mouth.    [provider]  HYDROcodone-acetaminophen (NORCO/VICODIN) 5-325 MG tablet Take 1 tablet by mouth every 6 (six) hours as needed. 07/07/21  Yes Shakiya Mcneary, PA-C  ibuprofen (ADVIL) 800 MG tablet Take 1 tablet (800 mg total) by mouth every 8 (eight) hours as needed. 07/05/21   Drema Dallas, DO  Multiple Vitamins-Minerals (MULTIPLE VITAMINS/WOMENS PO) Take by mouth.    [provider]  ondansetron (ZOFRAN ODT) 4 MG disintegrating tablet Take 1 tablet (4 mg total) by mouth every 8 (eight) hours as needed for nausea or vomiting. 07/07/21   Mikaele Stecher, PA-C  oxyCODONE (ROXICODONE) 5 MG immediate release tablet Take 1 tablet (5 mg total) by mouth every 6 (six) hours as needed for severe pain. 07/05/21   Drema Dallas, DO    Allergies    Patient has no known allergies.  Review of Systems   Review of Systems  Constitutional:  Negative for appetite change, chills and fever.  HENT:  Negative for ear pain, rhinorrhea, sneezing and sore throat.  Eyes:  Negative for photophobia and visual disturbance.  Respiratory:  Negative for cough, chest tightness, shortness of breath and wheezing.   Cardiovascular:  Negative for chest pain and palpitations.  Gastrointestinal:  Positive for abdominal pain, nausea and vomiting. Negative for blood in stool, constipation and diarrhea.  Genitourinary:  Positive for pelvic pain. Negative for dysuria, hematuria and urgency.  Musculoskeletal:  Negative for myalgias.  Skin:  Negative for rash.  Neurological:  Negative for dizziness, weakness and light-headedness.   Physical Exam Updated Vital Signs BP 125/69 (BP Location: Right Arm)    Pulse 78   Temp 98.5 F (36.9 C) (Oral)   Resp 15   LMP 06/17/2021 (Exact Date)   SpO2 98%   Physical Exam Vitals and nursing note reviewed.  Constitutional:      General: She is not in acute distress.    Appearance: She is well-developed.  HENT:     Head: Normocephalic and atraumatic.     Nose: Nose normal.  Eyes:     General: No scleral icterus.       Left eye: No discharge.     Conjunctiva/sclera: Conjunctivae normal.  Cardiovascular:     Rate and Rhythm: Normal rate and regular rhythm.     Heart sounds: Normal heart sounds. No murmur heard.   No friction rub. No gallop.  Pulmonary:     Effort: Pulmonary effort is normal. No respiratory distress.     Breath sounds: Normal breath sounds.  Abdominal:     General: Bowel sounds are normal. There is no distension.     Palpations: Abdomen is soft.     Tenderness: There is abdominal tenderness in the right lower quadrant, epigastric area, suprapubic area and left lower quadrant. There is no guarding.    Musculoskeletal:        General: Normal range of motion.     Cervical back: Normal range of motion and neck supple.  Skin:    General: Skin is warm and dry.     Findings: No rash.  Neurological:     Mental Status: She is alert.     Motor: No abnormal muscle tone.     Coordination: Coordination normal.    ED Results / Procedures / Treatments   Labs (all labs ordered are listed, but only abnormal results are displayed) Labs Reviewed  LIPASE, BLOOD - Abnormal; Notable for the following components:      Result Value   Lipase 151 (*)    All other components within normal limits  COMPREHENSIVE METABOLIC PANEL - Abnormal; Notable for the following components:   Glucose, Bld 102 (*)    All other components within normal limits  CBC - Abnormal; Notable for the following components:   WBC 16.2 (*)    All other components within normal limits  URINALYSIS, ROUTINE W REFLEX MICROSCOPIC - Abnormal; Notable for the following  components:   Color, Urine STRAW (*)    APPearance HAZY (*)    Specific Gravity, Urine 1.004 (*)    Hgb urine dipstick MODERATE (*)    Leukocytes,Ua SMALL (*)    Bacteria, UA RARE (*)    All other components within normal limits  LACTIC ACID, PLASMA  I-STAT BETA HCG BLOOD, ED ([facility], [facility], [facility] ONLY)    EKG None  Radiology DG Chest 2 View  Result Date: 07/07/2021 CLINICAL DATA:  Shortness of breath, fever. EXAM: CHEST - 2 VIEW COMPARISON:  None. FINDINGS: The heart size and mediastinal contours are within normal limits.  Both lungs are clear. The visualized skeletal structures are unremarkable. IMPRESSION: No active cardiopulmonary disease. Electronically Signed   By: Marijo Conception M.D.   On: 07/07/2021 08:54   CT ABDOMEN PELVIS W CONTRAST  Result Date: 07/07/2021 CLINICAL DATA:  25 year old female with recent resection of a left ovarian cyst, postoperative day 2. Fever of 101 and redness at the incision. EXAM: CT ABDOMEN AND PELVIS WITH CONTRAST TECHNIQUE: Multidetector CT imaging of the abdomen and pelvis was performed using the standard protocol following bolus administration of intravenous contrast. CONTRAST:  170mL OMNIPAQUE IOHEXOL 300 MG/ML  SOLN COMPARISON:  CT Abdomen and Pelvis 05/18/2021. FINDINGS: Lower chest: Lower lung volumes with platelike atelectasis at both lung bases. Additional patchy ground-glass opacity and mosaic attenuation, likely atelectasis and gas trapping. No pleural effusion. No pericardial effusion. Hepatobiliary: Negative liver and gallbladder. Pancreas: Negative. Spleen: Negative. Adrenals/Urinary Tract: Normal adrenal glands. Kidneys are symmetric and normal. No hydronephrosis or hydroureter. Diminutive bladder appears unremarkable. Stomach/Bowel: Negative large bowel aside from mild redundancy and retained stool. Normal appendix on series 3, image 58. Terminal ileum is negative. No dilated small bowel. Stomach and duodenum are fairly decompressed. There is trace  free air under the right hemidiaphragm (series 3, image 12), but no other pneumoperitoneum identified. No abdominal free fluid. No mesenteric inflammation identified. Vascular/Lymphatic: Major vascular structures in the abdomen and pelvis appear patent and normal. No lymphadenopathy. Reproductive: The uterus appears within normal limits. The large cystic mass of the left adnexa has been resected. Residual bilateral ovarian parenchyma appears heterogeneous on series 3, image 76, but there is no discrete ovarian mass or lesion. Mild periumbilical postoperative changes. No abdominal wall abscess or soft tissue gas. Other: No pelvic free fluid. Musculoskeletal: Negative. IMPRESSION: 1. Recently resected large left adnexal cyst with no adverse postoperative features (trace residual operative pneumoperitoneum). Both ovaries appear heterogeneous now, but there is no discrete ovarian mass or lesion. 2. Lower lung volumes with bilateral segmental atelectasis. Electronically Signed   By: Genevie Ann M.D.   On: 07/07/2021 09:46    Procedures Procedures   Medications Ordered in ED Medications  ondansetron (ZOFRAN-ODT) disintegrating tablet 4 mg (4 mg Oral Given 07/06/21 2304)  ondansetron (ZOFRAN-ODT) disintegrating tablet 4 mg (4 mg Oral Given 07/07/21 0346)  oxyCODONE (Oxy IR/ROXICODONE) immediate release tablet 5 mg (5 mg Oral Given 07/07/21 0346)  sodium chloride 0.9 % bolus 1,000 mL (0 mLs Intravenous Stopped 07/07/21 0946)  morphine 4 MG/ML injection 4 mg (4 mg Intravenous Given 07/07/21 0800)  iohexol (OMNIPAQUE) 300 MG/ML solution 100 mL (100 mLs Intravenous Contrast Given 07/07/21 0918)  morphine 4 MG/ML injection 4 mg (4 mg Intravenous Given 07/07/21 1059)    ED Course  I have reviewed the triage vital signs and the nursing notes.  Pertinent labs & imaging results that were available during my care of the patient were reviewed by me and considered in my medical decision making (see chart for  details).  Clinical Course as of 07/07/21 1142  Thu Jul 07, 2021  0736 Lipase(!): 151 [HK]  0736 WBC(!): 16.2 [HK]  0736 Creatinine: 0.66 [HK]  0736 Lactic Acid, Venous: 1.5 [HK]  1015 Consult with Dr. Delora Fuel.  I spoke to the nurse at the clinic and they will call me back [HK]  1030 Consult with Dr. Delora Fuel nurse [HK]    Clinical Course User Index [HK] Delia Heady, PA-C   MDM Rules/Calculators/A&P  25 year old female presenting to the ED for abdominal pain, vomiting, fever.  She is status post laparoscopic ovarian cystectomy on left on 07/05/2021.  She has been having fevers with T-max 101, vomiting, pelvic pain and epigastric pain.  She had stopped bleeding vaginally since the surgery but then noticed some vaginal bleeding with wiping while in the waiting room last night.  Pain is minimally improved with oxycodone.  Reports decreased appetite and "I just feel full really fast when I do eat."  Denies any chest pain, dysuria.  On exam there is some erythema inferior to the umbilicus with surrounding tenderness as well as diffuse lower abdominal tenderness and epigastric tenderness without rebound or guarding.  Her vital signs are within normal limits.  Her lungs are clear to auscultation bilaterally.  Work-up initiated in triage is significant for leukocytosis of 16 as well as lipase of 151.  Urinalysis with some leukocytes and bacteria but she is asymptomatic.  Her hCG is negative.  Will obtain chest x-ray, CT of abdomen pelvis to evaluate for postop complication versus complicated pancreatitis and treat symptomatically.  CT of the abdomen pelvis without any acute findings.  There are some expected postop changes.  No abnormalities noted with the pancreas. Per Dr. Delora Fuel recommendation who is her OB/GYN who did her procedure 2 days ago.  They recommend we can place her on antibiotics for potential cellulitis at the site of the incision.  However this mild vaginal bleeding  and pain is to be expected.  Her work-up otherwise without any signs of severe dehydration.  She was given IV fluids, pain medication here with significant improvement.  She is tolerating p.o. intake without difficulty.  Discussed admission versus discharge and she is agreeable to managing her symptoms at home and following up with her OB/GYN as scheduled.  We will place her on Keflex for cellulitis.  Return precautions given.   Patient is hemodynamically stable, in NAD, and able to ambulate in the ED. Evaluation does not show pathology that would require ongoing emergent intervention or inpatient treatment. I explained the diagnosis to the patient. Pain has been managed and has no complaints prior to discharge. Patient is comfortable with above plan and is stable for discharge at this time. All questions were answered prior to disposition. Strict return precautions for returning to the ED were discussed. Encouraged follow up with PCP.   Prior to providing a prescription for a controlled substance, I independently reviewed the patient's recent prescription history on the [facility]. The patient had no recent or regular prescriptions and was deemed appropriate for a brief, less than 3 day prescription of narcotic for acute analgesia.  An After Visit Summary was printed and given to the patient.   Portions of this note were generated with [employer]. Dictation errors may occur despite best attempts at proofreading.  Final Clinical Impression(s) / ED Diagnoses Final diagnoses:  Post-operative pain  Cellulitis of abdominal wall    Rx / DC Orders ED Discharge Orders          Ordered    ondansetron (ZOFRAN ODT) 4 MG disintegrating tablet  Every 8 hours PRN,   Status:  Discontinued        07/07/21 1102    cephALEXin (KEFLEX) 500 MG capsule  2 times daily,   Status:  Discontinued        07/07/21 1102    cephALEXin (KEFLEX) 500 MG capsule  2  times daily  07/07/21 1141    ondansetron (ZOFRAN ODT) 4 MG disintegrating tablet  Every 8 hours PRN        07/07/21 1141    HYDROcodone-acetaminophen (NORCO/VICODIN) 5-325 MG tablet  Every 6 hours PRN        07/07/21 1141             Delia Heady, PA-C 07/07/21 1142    Lajean Saver, MD 07/07/21 1352

## 2021-07-07 NOTE — Discharge Instructions (Signed)
Take antibiotics as prescribed. You can continue taking the pain medicine and use the Zofran as needed for nausea. Follow-up with your OB/GYN. Return to the ER for worsening symptoms, persistent vomiting, profound bleeding, chest pain or shortness of breath.

## 2021-10-22 ENCOUNTER — Other Ambulatory Visit: Payer: Self-pay | Admitting: Physician Assistant

## 2021-12-01 ENCOUNTER — Other Ambulatory Visit: Payer: Self-pay | Admitting: Physician Assistant

## 2023-05-21 IMAGING — CR DG CHEST 2V
2 series · 2 of 2 positions shown · non-contrast
Comparison: None.

CLINICAL DATA: Shortness of breath, fever.

EXAM:
CHEST - 2 VIEW

[chest lat]
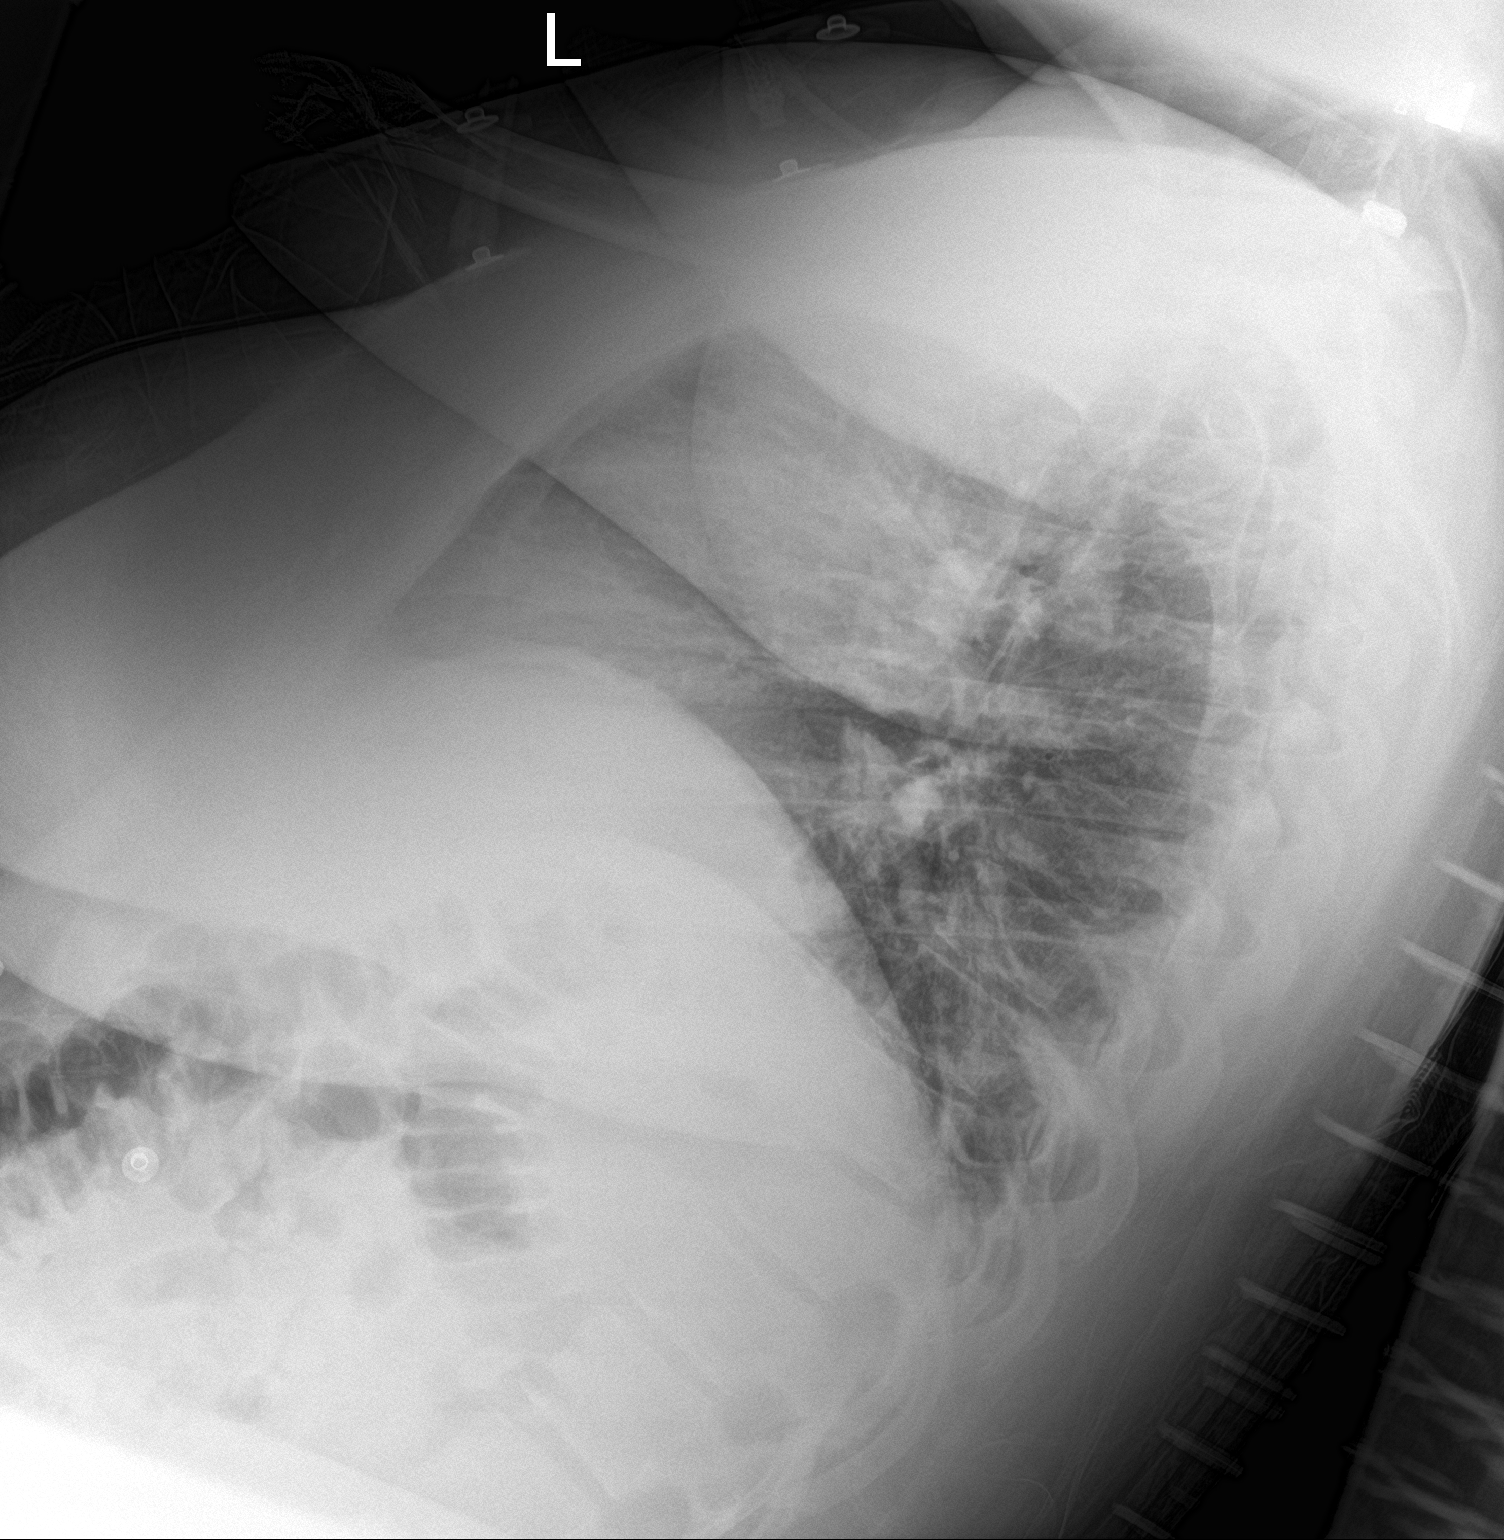

[chest ap]
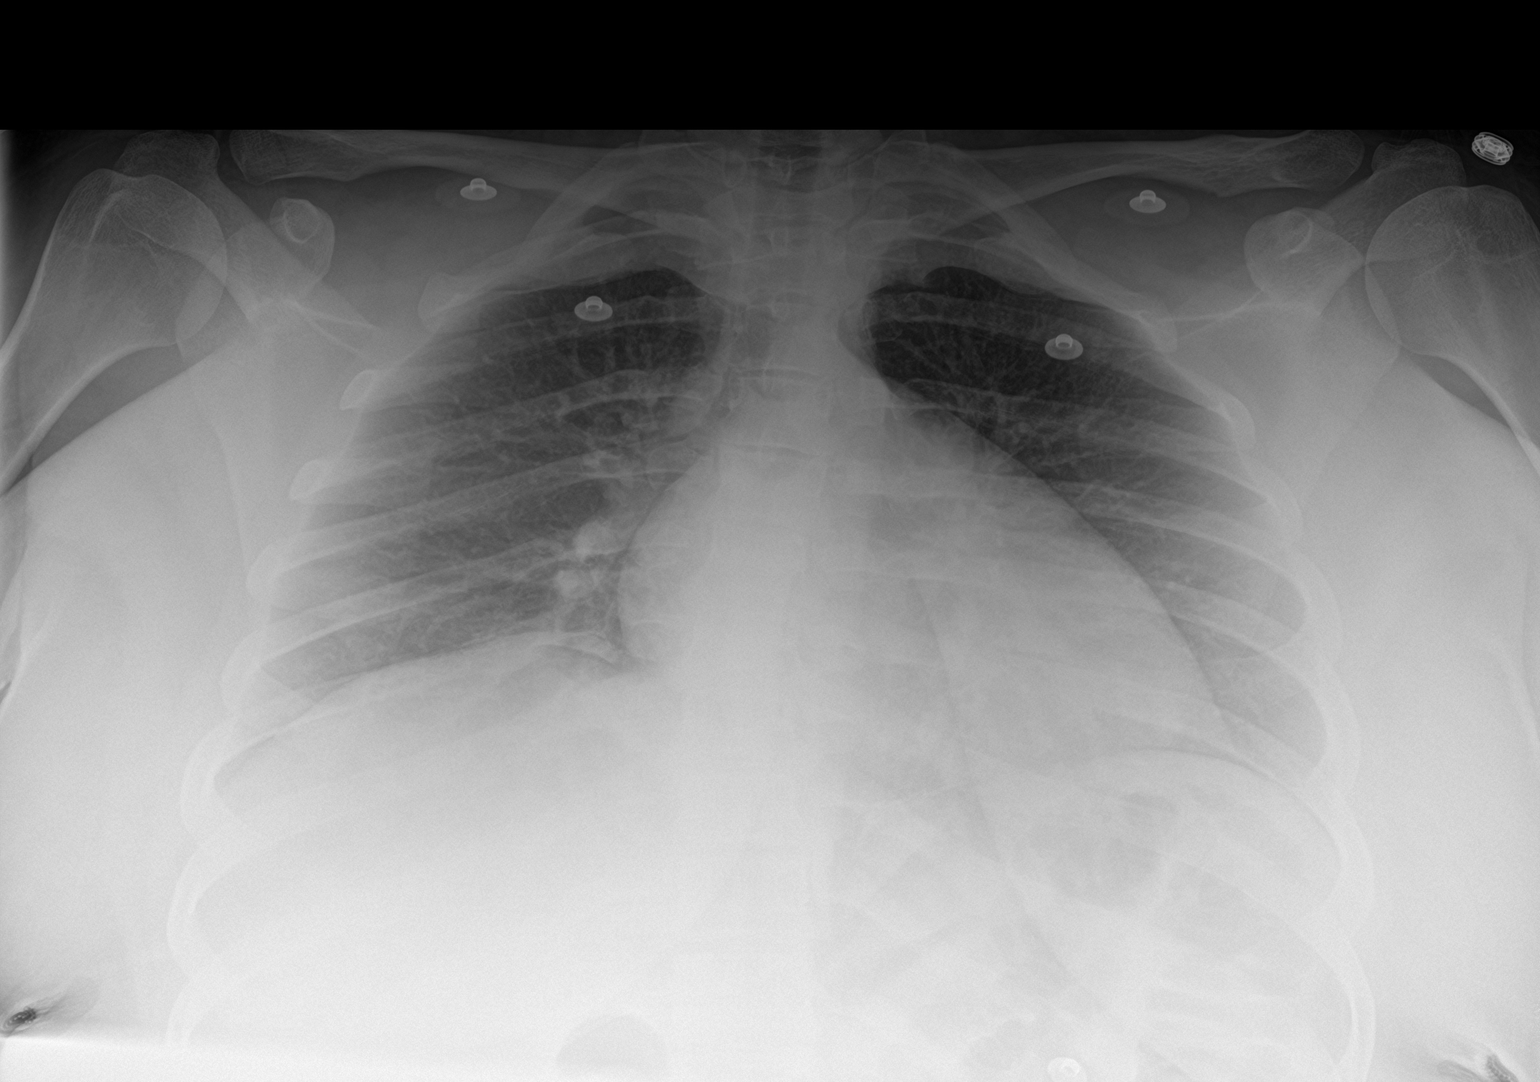

[2 of 2 positions shown; findings below may reference images not displayed]

FINDINGS: The heart size and mediastinal contours are within normal limits.
Both lungs are clear. The visualized skeletal structures are
unremarkable.
IMPRESSION: No active cardiopulmonary disease.

## 2023-05-21 IMAGING — CT CT ABD-PELV W/ CM
2 of 4 series · 15 of 46 positions shown, 17 images · IV contrast (omnipaque)
Comparison: CT Abdomen and Pelvis 05/18/2021.

CLINICAL DATA: 25-year-old female with recent resection of a left
ovarian cyst, postoperative day 2. Fever of 101 and redness at the
incision.

EXAM:
CT ABDOMEN AND PELVIS WITH CONTRAST
TECHNIQUE: Multidetector CT imaging of the abdomen and pelvis was performed
using the standard protocol following bolus administration of
intravenous contrast.
CONTRAST:  100mL OMNIPAQUE IOHEXOL 300 MG/ML  SOLN

[Series 3: abdomen 5.0 · axial · 0.95mm/px · z∈[+802,+1248]mm · 12 of 99 slices shown, 14 images]
[im 5/99  soft-tissue]
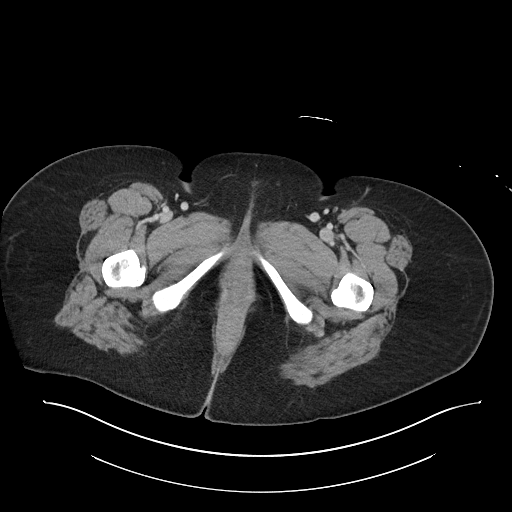
[im 5/99  bone]
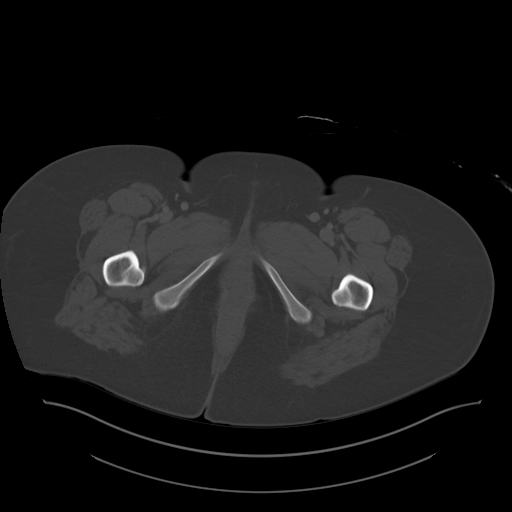
[im 15/99  soft-tissue]
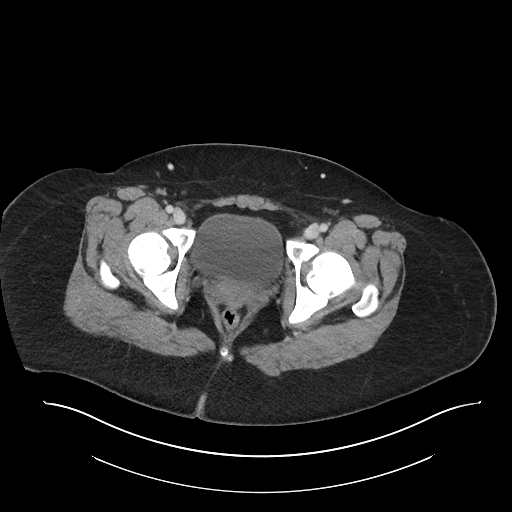
[im 20/99  soft-tissue]
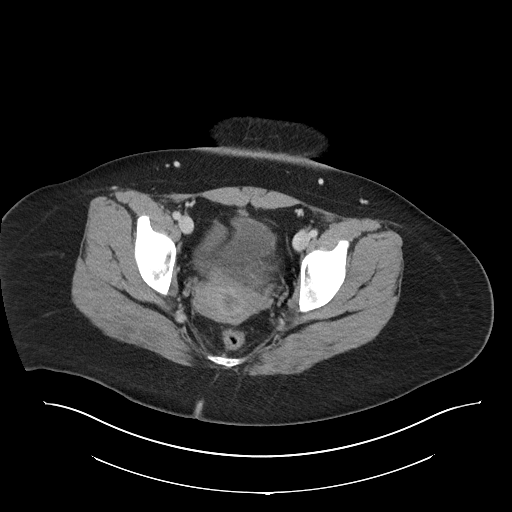
[im 30/99  soft-tissue]
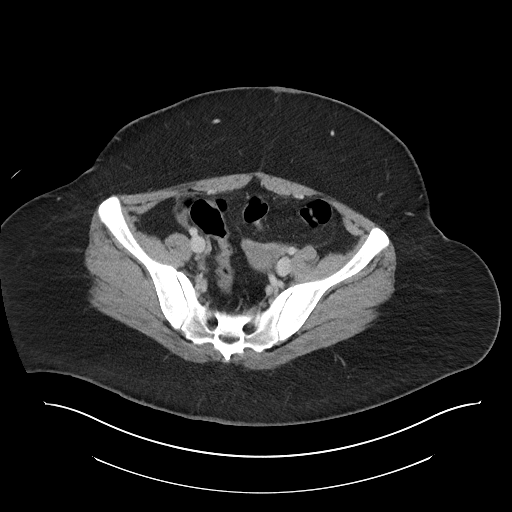
[im 40/99  soft-tissue]
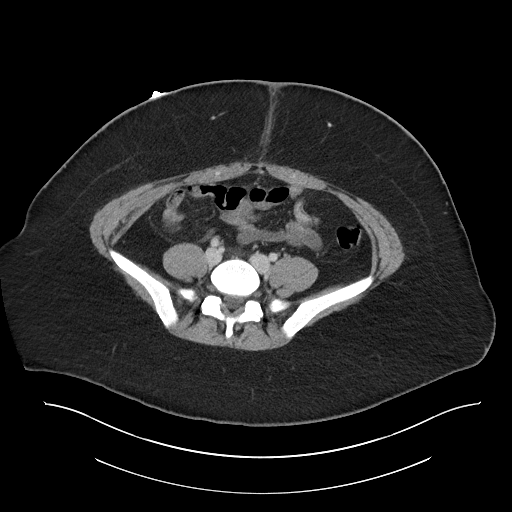
[im 45/99  soft-tissue]
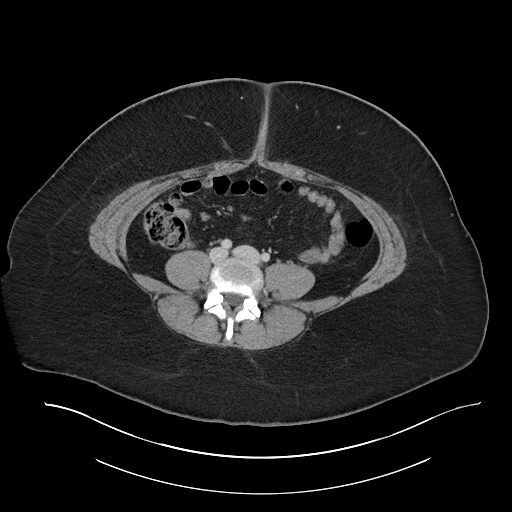
[im 54/99  soft-tissue]
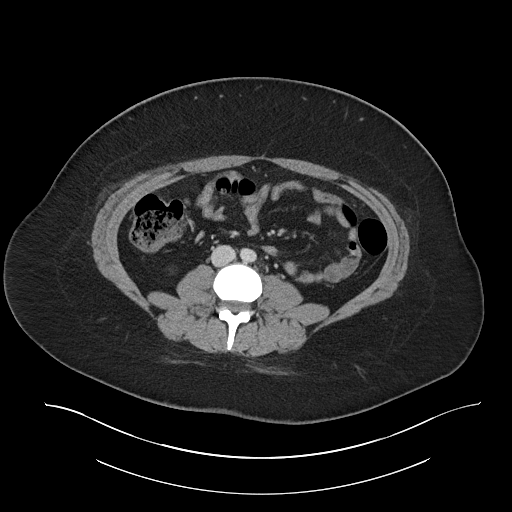
[im 59/99  soft-tissue]
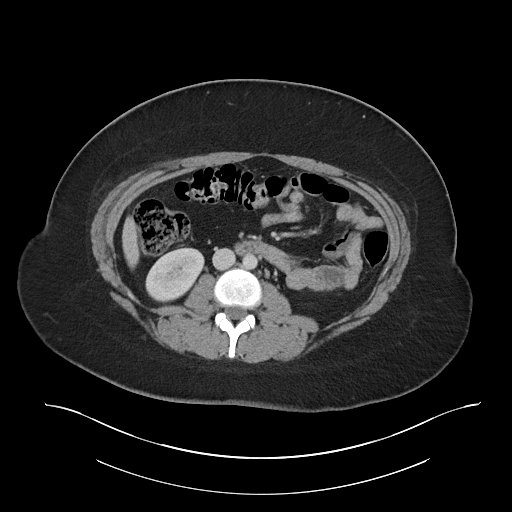
[im 69/99  soft-tissue]
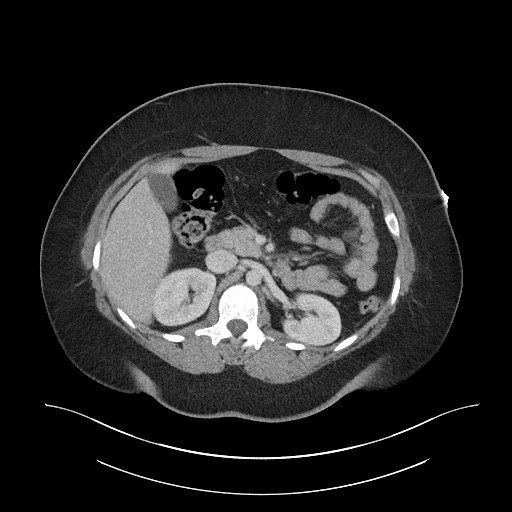
[im 69/99  bone]
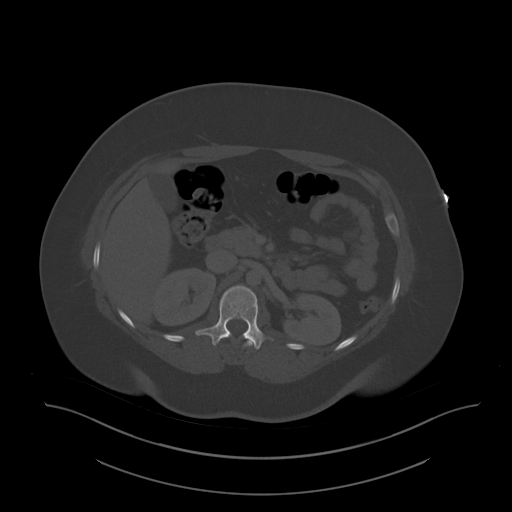
[im 79/99  soft-tissue]
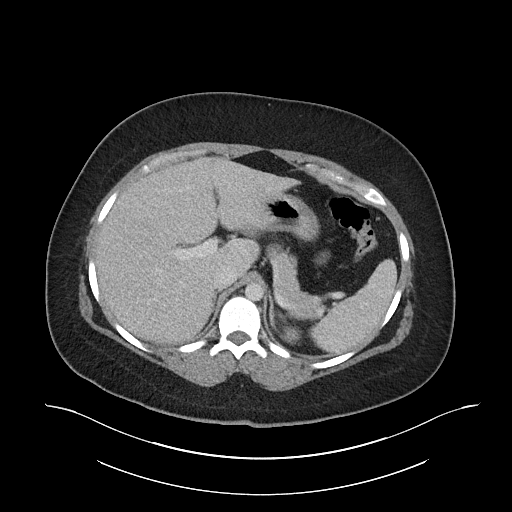
[im 84/99  soft-tissue]
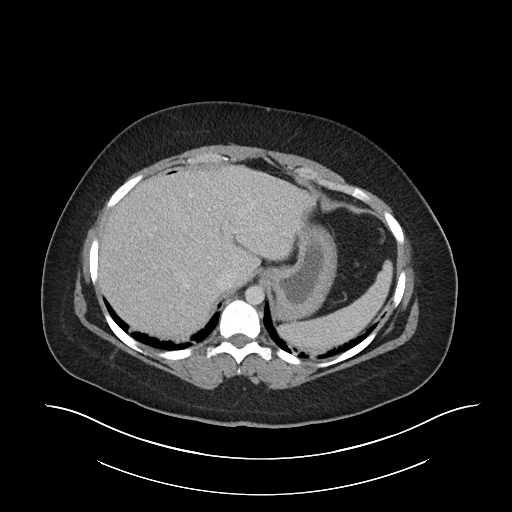
[im 94/99  soft-tissue]
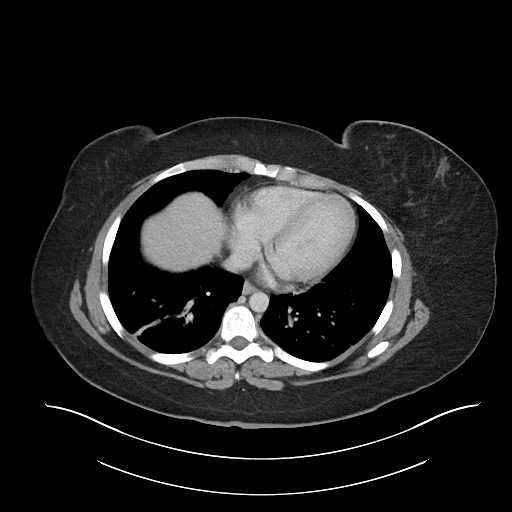

[Series 6: abdomen 3.0 mpr cor · coronal · 0.71mm/px · 3 of 112 slices shown]
[im 38/112  soft-tissue]
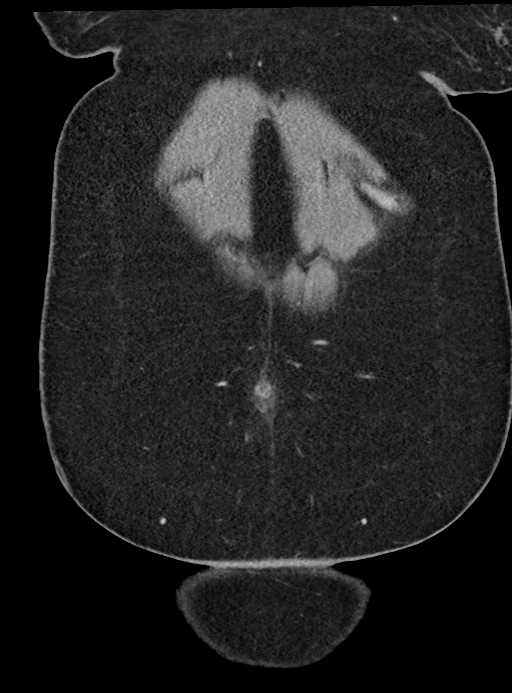
[im 50/112  soft-tissue]
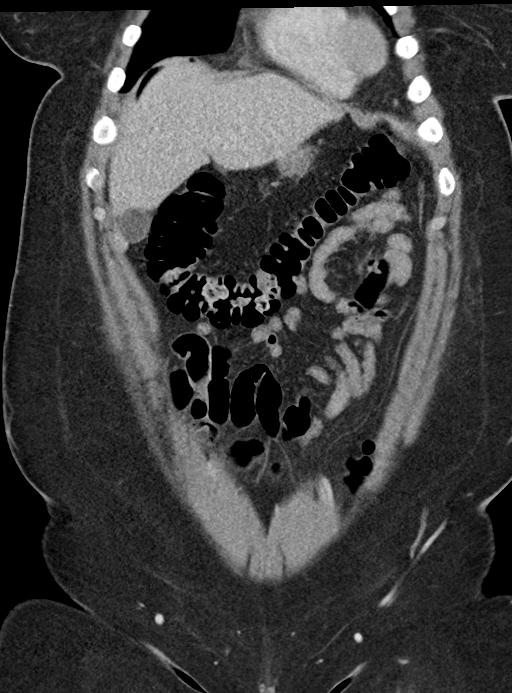
[im 62/112  soft-tissue]
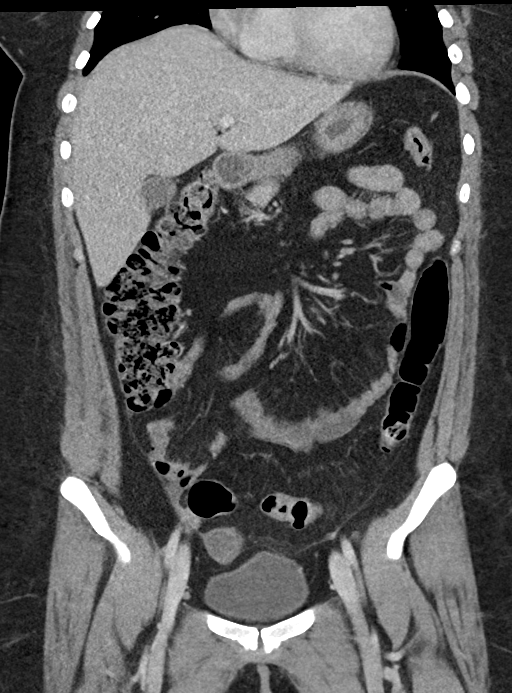

[15 of 46 positions shown; findings below may reference images not displayed]

FINDINGS: Lower chest: Lower lung volumes with platelike atelectasis at both
lung bases. Additional patchy ground-glass opacity and mosaic
attenuation, likely atelectasis and gas trapping. No pleural
effusion. No pericardial effusion.

Hepatobiliary: Negative liver and gallbladder.

Pancreas: Negative.

Spleen: Negative.

Adrenals/Urinary Tract: Normal adrenal glands. Kidneys are symmetric
and normal. No hydronephrosis or hydroureter. Diminutive bladder
appears unremarkable.

Stomach/Bowel: Negative large bowel aside from mild redundancy and
retained stool. Normal appendix on series 3, image 58. Terminal
ileum is negative. No dilated small bowel. Stomach and duodenum are
fairly decompressed.

There is trace free air under the right hemidiaphragm (series 3,
image 12), but no other pneumoperitoneum identified. No abdominal
free fluid. No mesenteric inflammation identified.

Vascular/Lymphatic: Major vascular structures in the abdomen and
pelvis appear patent and normal. No lymphadenopathy.

Reproductive: The uterus appears within normal limits. The large
cystic mass of the left adnexa has been resected. Residual bilateral
ovarian parenchyma appears heterogeneous on series 3, image 76, but
there is no discrete ovarian mass or lesion.

Mild periumbilical postoperative changes. No abdominal wall abscess
or soft tissue gas.

Other: No pelvic free fluid.

Musculoskeletal: Negative.
IMPRESSION: 1. Recently resected large left adnexal cyst with no adverse
postoperative features (trace residual operative pneumoperitoneum).
Both ovaries appear heterogeneous now, but there is no discrete
ovarian mass or lesion.

2. Lower lung volumes with bilateral segmental atelectasis.
